# Patient Record
Sex: Female | Born: 2000 | Race: White | Hispanic: No | Marital: Single | Smoking: Never smoker
Health system: Southern US, Community
[De-identification: ages and names within clinical notes are randomized; demographics above are authoritative.]

## PROBLEM LIST (undated history)

## (undated) DIAGNOSIS — J020 Streptococcal pharyngitis: Secondary | ICD-10-CM

## (undated) DIAGNOSIS — E669 Obesity, unspecified: Secondary | ICD-10-CM

## (undated) HISTORY — DX: Streptococcal pharyngitis: J02.0

## (undated) HISTORY — DX: Obesity, unspecified: E66.9

## (undated) HISTORY — PX: TYMPANOSTOMY TUBE PLACEMENT: SHX32

---
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
## 2018-03-21 ENCOUNTER — Ambulatory Visit: Payer: PRIVATE HEALTH INSURANCE

## 2018-07-01 NOTE — Telephone Encounter (Signed)
Opened in error

## 2019-04-21 ENCOUNTER — Other Ambulatory Visit: Payer: Self-pay

## 2019-04-21 DIAGNOSIS — Z20822 Contact with and (suspected) exposure to covid-19: Secondary | ICD-10-CM

## 2019-04-22 LAB — NOVEL CORONAVIRUS, NAA: SARS-CoV-2, NAA: NOT DETECTED

## 2019-05-14 ENCOUNTER — Other Ambulatory Visit: Payer: Self-pay

## 2019-05-14 DIAGNOSIS — Z20822 Contact with and (suspected) exposure to covid-19: Secondary | ICD-10-CM

## 2019-05-15 LAB — NOVEL CORONAVIRUS, NAA: SARS-CoV-2, NAA: DETECTED — AB

## 2019-09-13 ENCOUNTER — Ambulatory Visit: Payer: No Typology Code available for payment source | Attending: Internal Medicine

## 2019-09-13 DIAGNOSIS — Z23 Encounter for immunization: Secondary | ICD-10-CM | POA: Insufficient documentation

## 2019-09-13 NOTE — Progress Notes (Signed)
   Covid-19 Vaccination Clinic  Name:  Robin Cooley    MRN: [id number] DOB: 2001-04-14  09/13/2019  [name] was observed post Covid-19 immunization for 15 minutes without incident. She was provided with Vaccine Information Sheet and instruction to access the V-Safe system.   [name] was instructed to call 911 with any severe reactions post vaccine: Marland Kitchen Difficulty breathing  . Swelling of face and throat  . A fast heartbeat  . A bad rash all over body  . Dizziness and weakness   Immunizations Administered    Name Date Dose VIS Date Route   Pfizer COVID-19 Vaccine 09/13/2019  4:35 PM 0.3 mL 06/20/2019 Intramuscular   Manufacturer: [employer], [employer]   Lot: [id number]   NDC: [address]-5

## 2019-09-15 ENCOUNTER — Ambulatory Visit (INDEPENDENT_AMBULATORY_CARE_PROVIDER_SITE_OTHER)
Admission: RE | Admit: 2019-09-15 | Discharge: 2019-09-15 | Disposition: A | Discharge status: Home / Self Care | Payer: Commercial Managed Care - PPO | Source: Ambulatory Visit

## 2019-09-15 DIAGNOSIS — L139 Bullous disorder, unspecified: Secondary | ICD-10-CM | POA: Diagnosis not present

## 2019-09-15 MED ORDER — TRIAMCINOLONE ACETONIDE 0.5 % EX OINT: 1.0000 "application " | TOPICAL_OINTMENT | Freq: Two times a day (BID) | CUTANEOUS | 0 refills | Status: AC

## 2019-09-15 NOTE — Discharge Instructions (Addendum)
Keep skin clean and dry.  May cover to avoid having fabrics rub across/irritate lesion. Apply triamcinolone to affected area only twice daily.   Do not puncture wound as can introduce bacteria, increased risk of infection, and cause more pain. Recommend you were seen in person for worsening lesion (gets bigger, more painful, more red, or starts draining fluid), development of additional lesions, or you develop fever, chills, joint pain, muscle pain.

## 2019-09-15 NOTE — ED Provider Notes (Signed)
Virtual Visit via Video Note:  Robin Cooley  initiated request for Telemedicine visit with [facility] team. I connected with Robin Cooley  on 09/15/2019 at 2:54 PM  for a synchronized telemedicine visit using a video enabled HIPPA compliant telemedicine application. I verified that I am speaking with Robin Cooley  using two identifiers. [place] Hall-Potvin, PA-C  was physically located in a [facility]gent care site and Robin Cooley was located at a different location.   The limitations of evaluation and management by telemedicine as well as the availability of in-person appointments were discussed. Patient was informed that she  may incur a bill ( including co-pay) for this virtual visit encounter. Robin Cooley  expressed understanding and gave verbal consent to proceed with virtual visit.     History of Present Illness:Robin Cooley  is a 19 y.o. female presents with concerns for blister over posterior aspect of left upper arm.  States she received Covid vaccine Saturday afternoon and had some mild injection site pain later that day.  Otherwise tolerated well.  States she woke up this morning with a blister the size of a pencil eraser tip on the back of her arm.  States this is not where she received her shot.  Denies other lesions, pain, itching.  No fever, arthralgias, myalgias, fatigue, cough, shortness of breath or chest pain.  Has not tried thing for this.  Denies known bug bites, change in detergents, topical products.     ROI as per HPI  Past Medical History:  Diagnosis Date  . Abdominal pain    resolved.  . Obesity   . Strep throat     No Known Allergies      Observations/Objective: 19 y.o. female sitting in no acute distress, is nontoxic.  Patient is able to speak in full sentences without coughing, sneezing, wheezing.  Small bulla noted over back of left upper arm.  Trace erythema surrounding.  No open wound  or active discharge, streaking, other lesions appreciated.  Patient states this is approximately the size of a pencil eraser (5-7 mm).   Assessment and Plan: Lesion consistent with small bulla: We will treat with triamcinolone, keep area covered and reviewed not to puncture wound.  Provided anticipatory guidance on systemic symptoms prompting in person evaluation.  Return precautions discussed, patient verbalized understanding and is agreeable to plan.  Follow Up Instructions: Patient to seek in-person evaluation for persistent or worsening symptoms.   I discussed the assessment and treatment plan with the patient. The patient was provided an opportunity to ask questions and all were answered. The patient agreed with the plan and demonstrated an understanding of the instructions.   The patient was advised to call back or seek an in-person evaluation if the symptoms worsen or if the condition fails to improve as anticipated.  I provided 15 minutes of non-face-to-face time during this encounter.    [place] Hall-Potvin, PA-C  09/15/2019 2:54 PM        Hall-Potvin, [place], PA-C 09/15/19 1506

## 2019-10-04 ENCOUNTER — Ambulatory Visit: Payer: No Typology Code available for payment source | Attending: Internal Medicine

## 2019-10-04 DIAGNOSIS — Z23 Encounter for immunization: Secondary | ICD-10-CM

## 2019-10-04 NOTE — Progress Notes (Signed)
   Covid-19 Vaccination Clinic  Name:  Robin Cooley    MRN: [id number] DOB: Feb 04, 2001  10/04/2019  [name] was observed post Covid-19 immunization for 15 minutes without incident. She was provided with Vaccine Information Sheet and instruction to access the V-Safe system.   [name] was instructed to call 911 with any severe reactions post vaccine: Marland Kitchen Difficulty breathing  . Swelling of face and throat  . A fast heartbeat  . A bad rash all over body  . Dizziness and weakness   Immunizations Administered    Name Date Dose VIS Date Route   Pfizer COVID-19 Vaccine 10/04/2019  3:57 PM 0.3 mL 06/20/2019 Intramuscular   Manufacturer: [employer], [employer]   Lot: [id number]   NDC: [address]-2

## 2024-06-27 ENCOUNTER — Ambulatory Visit: Admitting: Family Medicine

## 2024-08-04 ENCOUNTER — Ambulatory Visit: Admitting: Family Medicine

## 2024-09-26 ENCOUNTER — Ambulatory Visit: Admitting: Family Medicine
